# Patient Record
Sex: Male | Born: 1980 | Race: Black or African American | Hispanic: No | Marital: Single | Smoking: Never smoker
Health system: Southern US, Community
[De-identification: ages and names within clinical notes are randomized; demographics above are authoritative.]

## PROBLEM LIST (undated history)

## (undated) DIAGNOSIS — B20 Human immunodeficiency virus [HIV] disease: Secondary | ICD-10-CM

---

## 2017-08-05 DIAGNOSIS — T50905A Adverse effect of unspecified drugs, medicaments and biological substances, initial encounter: Secondary | ICD-10-CM | POA: Diagnosis not present

## 2017-08-05 DIAGNOSIS — R21 Rash and other nonspecific skin eruption: Secondary | ICD-10-CM | POA: Diagnosis not present

## 2017-08-06 ENCOUNTER — Other Ambulatory Visit: Payer: Self-pay

## 2017-08-06 ENCOUNTER — Emergency Department (HOSPITAL_COMMUNITY)
Admission: EM | Admit: 2017-08-06 | Discharge: 2017-08-06 | Disposition: A | Discharge status: Home / Self Care | Payer: 59 | Attending: Emergency Medicine | Admitting: Emergency Medicine

## 2017-08-06 ENCOUNTER — Encounter (HOSPITAL_COMMUNITY): Payer: Self-pay | Admitting: Emergency Medicine

## 2017-08-06 DIAGNOSIS — R21 Rash and other nonspecific skin eruption: Secondary | ICD-10-CM | POA: Diagnosis not present

## 2017-08-06 DIAGNOSIS — Z79899 Other long term (current) drug therapy: Secondary | ICD-10-CM | POA: Diagnosis not present

## 2017-08-06 DIAGNOSIS — T7840XA Allergy, unspecified, initial encounter: Secondary | ICD-10-CM | POA: Diagnosis not present

## 2017-08-06 DIAGNOSIS — T378X5A Adverse effect of other specified systemic anti-infectives and antiparasitics, initial encounter: Secondary | ICD-10-CM | POA: Diagnosis not present

## 2017-08-06 MED ORDER — HYDROXYZINE HCL 10 MG PO TABS: 10.0000 mg | ORAL_TABLET | Freq: Four times a day (QID) | ORAL | 0 refills | Status: AC | PRN

## 2017-08-06 NOTE — ED Provider Notes (Signed)
[facility]-EMERGENCY DEPT Provider Note   CSN: [id number] Arrival date & time: 08/06/17  [id number]     History   Chief Complaint Chief Complaint  Patient presents with  . Allergic Reaction    HPI Timothy Trevino is a 36 y.o. male.  Timothy Trevino is a 36 y.o. Male who presents to the ED complaining of an allergic reaction to flagyl. Patient reports he took one dose of flagyl yesterday and started having itching, rash, and lip tingling. He went to urgent care who told him to stop taking Flagyl and started him on prednisone. He has also been taking benadryl and took some before arrival. Patient reports his lip tingling and rash has resolved. He came to the ED because he was itching so bad. He reports even the itching seems to be much better after taking benadryl. He was started on flagyl by his GI doctor for his BMs "not having the right consistency."  Denies any changes to his lotions, perfumes, soaps or detergents.  No new plants or animals in the house.  He has not had any trouble swallowing, tongue swelling. No SOB. No fevers, abdominal pain, nausea or vomiting.    The history is provided by the patient and medical records. No language interpreter was used.  Allergic Reaction  Presenting symptoms: rash (resolved )   Presenting symptoms: no difficulty swallowing and no wheezing     History reviewed. No pertinent past medical history.  There are no active problems to display for this patient.   History reviewed. No pertinent surgical history.     Home Medications    Prior to Admission medications   Medication Sig Start Date End Date Taking? Authorizing Provider  predniSONE (DELTASONE) 20 MG tablet Take 40 mg by mouth daily with breakfast.   Yes [provider]  hydrOXYzine (ATARAX/VISTARIL) 10 MG tablet Take 1 tablet (10 mg total) by mouth every 6 (six) hours as needed for itching. 08/06/17   Everlene Farrieransie, Tykeshia Tourangeau, PA-C    Family History Family  History  Problem Relation Age of Onset  . Diabetes Other   . Hypertension Other   . Depression Other     Social History Social History   Tobacco Use  . Smoking status: Never Smoker  . Smokeless tobacco: Never Used  Substance Use Topics  . Alcohol use: Yes    Comment: social   . Drug use: No     Allergies   Phenergan [promethazine hcl] and Flagyl [metronidazole]   Review of Systems Review of Systems  Constitutional: Negative for fever.  HENT: Negative for sore throat, trouble swallowing and voice change.   Eyes: Negative for visual disturbance.  Respiratory: Negative for cough, shortness of breath and wheezing.   Cardiovascular: Negative for chest pain.  Gastrointestinal: Negative for abdominal pain, diarrhea, nausea and vomiting.  Genitourinary: Negative for dysuria.  Musculoskeletal: Negative for back pain.  Skin: Positive for rash (resolved ).  Neurological: Negative for light-headedness and headaches.     Physical Exam Updated Vital Signs BP (!) 134/91 (BP Location: Right Arm)   Pulse 79   Temp 97.7 F (36.5 C) (Oral)   Resp 20   SpO2 100%   Physical Exam  Constitutional: He appears well-developed and well-nourished. No distress.  HENT:  Head: Normocephalic and atraumatic.  Right Ear: External ear normal.  Left Ear: External ear normal.  Mouth/Throat: Oropharynx is clear and moist. No oropharyngeal exudate.  Throat is clear. No drooling. No tongue or lip swelling.  Eyes: Conjunctivae are normal. Pupils are equal, round, and reactive to light. Right eye exhibits no discharge. Left eye exhibits no discharge.  Neck: Neck supple.  Cardiovascular: Normal rate, regular rhythm, normal heart sounds and intact distal pulses.  Pulmonary/Chest: Effort normal and breath sounds normal. No stridor. No respiratory distress. He has no wheezes. He has no rales.  Lungs are clear to ascultation bilaterally. Symmetric chest expansion bilaterally. No increased work of  breathing. No rales or rhonchi.    Abdominal: Soft. There is no tenderness.  Musculoskeletal: He exhibits no edema.  Lymphadenopathy:    He has no cervical adenopathy.  Neurological: He is alert. Coordination normal.  Skin: Skin is warm and dry. Capillary refill takes less than 2 seconds. No rash noted. He is not diaphoretic. No erythema. No pallor.  No rashes noted. No hives.   Psychiatric: He has a normal mood and affect. His behavior is normal.  Nursing note and vitals reviewed.    ED Treatments / Results  Labs (all labs ordered are listed, but only abnormal results are displayed) Labs Reviewed - No data to display  EKG  EKG Interpretation None       Radiology No results found.  Procedures Procedures (including critical care time)  Medications Ordered in ED Medications - No data to display   Initial Impression / Assessment and Plan / ED Course  I have reviewed the triage vital signs and the nursing notes.  Pertinent labs & imaging results that were available during my care of the patient were reviewed by me and considered in my medical decision making (see chart for details).    This is a 36 y.o. Male who presents to the ED complaining of an allergic reaction to flagyl. Patient reports he took one dose of flagyl yesterday and started having itching, rash, and lip tingling. He went to urgent care who told him to stop taking Flagyl and started him on prednisone. He has also been taking benadryl and took some before arrival. Patient reports his lip tingling and rash has resolved. He came to the ED because he was itching so bad. He reports even the itching seems to be much better after taking benadryl. He was started on flagyl by his GI doctor for his BMs "not having the right consistency."  Denies any changes to his lotions, perfumes, soaps or detergents.  No new plants or animals in the house.  He has not had any trouble swallowing, tongue swelling. No SOB. On exam the  patient is afebrile nontoxic-appearing.  Lungs are clear to auscultation bilaterally.  No increased work of breathing.  No tongue or lip swelling.  No drooling.  I see no rashes on his body.  I advised that as he is discontinue taking the Flagyl this should likely get better over time.  He can continue taking Benadryl as needed for itching and/or Atarax for itching.  He can continue several days of prednisone, but I did not recommend L extended course of prednisone.  Return precautions discussed. I advised the patient to follow-up with their primary care provider this week. I advised the patient to return to the emergency department with new or worsening symptoms or new concerns. The patient verbalized understanding and agreement with plan.     Final Clinical Impressions(s) / ED Diagnoses   Final diagnoses:  Allergic reaction to drug, initial encounter    ED Discharge Orders        Ordered    hydrOXYzine (ATARAX/VISTARIL) 10 MG tablet  Every 6 hours PRN     08/06/17 0754       Everlene Farrier, PA-C 08/06/17 0830    Little, Ambrose Finland, MD 08/08/17 2133

## 2017-08-06 NOTE — ED Triage Notes (Signed)
Pt states he had an allergic reaction to flagyl yesterday morning where his lips were swollen and he was itching all over  Pt was seen at urgent care and was given a steroid shot and prescribed prednisone  Pt took a benadryl before going to work last night and when he got off at 3am he went home and took Benadryl 50 mg po  Pt is c/o severe itching and irritation at this time

## 2017-09-20 MED FILL — STRIBILD TABLET: 150-150-200 | 30 days supply | Qty: 30 | Fill #0

## 2017-10-03 DIAGNOSIS — J0140 Acute pansinusitis, unspecified: Secondary | ICD-10-CM | POA: Diagnosis not present

## 2017-10-21 MED FILL — STRIBILD TABLET: 150-150-200 | 30 days supply | Qty: 30 | Fill #1

## 2017-11-26 MED FILL — STRIBILD TABLET: 150-150-200 | 30 days supply | Qty: 30 | Fill #2

## 2017-12-01 DIAGNOSIS — R197 Diarrhea, unspecified: Secondary | ICD-10-CM | POA: Diagnosis not present

## 2017-12-01 DIAGNOSIS — K5900 Constipation, unspecified: Secondary | ICD-10-CM | POA: Diagnosis not present

## 2017-12-01 DIAGNOSIS — R194 Change in bowel habit: Secondary | ICD-10-CM | POA: Diagnosis not present

## 2017-12-01 DIAGNOSIS — B20 Human immunodeficiency virus [HIV] disease: Secondary | ICD-10-CM | POA: Diagnosis not present

## 2017-12-06 DIAGNOSIS — R300 Dysuria: Secondary | ICD-10-CM | POA: Diagnosis not present

## 2017-12-06 DIAGNOSIS — F331 Major depressive disorder, recurrent, moderate: Secondary | ICD-10-CM | POA: Diagnosis not present

## 2017-12-06 DIAGNOSIS — E782 Mixed hyperlipidemia: Secondary | ICD-10-CM | POA: Diagnosis not present

## 2017-12-06 DIAGNOSIS — B20 Human immunodeficiency virus [HIV] disease: Secondary | ICD-10-CM | POA: Diagnosis not present

## 2017-12-06 DIAGNOSIS — Z6823 Body mass index (BMI) 23.0-23.9, adult: Secondary | ICD-10-CM | POA: Diagnosis not present

## 2017-12-29 MED FILL — STRIBILD TABLET: 150-150-200 | 30 days supply | Qty: 30 | Fill #3

## 2018-01-06 DIAGNOSIS — B20 Human immunodeficiency virus [HIV] disease: Secondary | ICD-10-CM | POA: Diagnosis not present

## 2018-01-06 DIAGNOSIS — Z7251 High risk heterosexual behavior: Secondary | ICD-10-CM | POA: Diagnosis not present

## 2018-02-02 MED FILL — STRIBILD TABLET: 150-150-200 | 30 days supply | Qty: 30 | Fill #0

## 2018-03-21 MED FILL — STRIBILD TABLET: 150-150-200 | 30 days supply | Qty: 30 | Fill #1

## 2018-04-22 ENCOUNTER — Ambulatory Visit (INDEPENDENT_AMBULATORY_CARE_PROVIDER_SITE_OTHER): Payer: 59 | Admitting: Pharmacist

## 2018-04-22 DIAGNOSIS — Z79899 Other long term (current) drug therapy: Secondary | ICD-10-CM

## 2018-04-22 MED ORDER — ELVITEG-COBIC-EMTRICIT-TENOFDF 150-150-200-300 MG PO TABS: 1.0000 | ORAL_TABLET | Freq: Every day | ORAL | 1 refills | Status: DC

## 2018-04-22 MED FILL — STRIBILD TABLET: 150-150-200 | 30 days supply | Qty: 30 | Fill #0

## 2018-04-22 NOTE — Progress Notes (Signed)
   S: [facility].   Patient is currently taking Stribild for  HIV. Patient is managed by Dr. Tivis RingerLarsen for this.   Adherence: denies any missed doses  Efficacy: reports that it works well for him. He was switched to another HIV med for renal protection but he switched back to Stribild after the other made him sick. He is very happy with the Stribild.  Current adverse effects: denies any adverse effects  Monitoring: HIV RNA: undetectable per patient Renal toxicity: reports ID monitors renal function and it has been normal. S/sx of bone loss: denies Musculoskeletal complaints: denies S/sx immune reconstitution syndrome: denies S/sx of lactic acidosis/hepatomegaly: denies    O:     No results found for: WBC, HGB, HCT, MCV, PLT    Chemistry   No results found for: NA, K, CL, CO2, BUN, CREATININE, GLU No results found for: CALCIUM, ALKPHOS, AST, ALT, BILITOT     A/P: 1. Medication review: patient currently on Stribild for HIV and is tolerating it well with no adverse effects and good control of his HIV. Reviewed the medication with him, including possible adverse effects, the need for 100% adherence, and to regularly get labs with his ID clinic. No recommendations for any changes at this time.     Alvino BloodStacey Karl Hammer, PharmD, BCPS, BCACP, CPP Clinical Pharmacist Practitioner  [phone]

## 2018-06-06 MED FILL — STRIBILD TABLET: 150-150-200 | 30 days supply | Qty: 30 | Fill #1

## 2018-06-09 DIAGNOSIS — E782 Mixed hyperlipidemia: Secondary | ICD-10-CM | POA: Diagnosis not present

## 2018-06-09 DIAGNOSIS — Z6824 Body mass index (BMI) 24.0-24.9, adult: Secondary | ICD-10-CM | POA: Diagnosis not present

## 2018-06-09 DIAGNOSIS — B20 Human immunodeficiency virus [HIV] disease: Secondary | ICD-10-CM | POA: Diagnosis not present

## 2018-06-09 DIAGNOSIS — F331 Major depressive disorder, recurrent, moderate: Secondary | ICD-10-CM | POA: Diagnosis not present

## 2018-07-18 ENCOUNTER — Other Ambulatory Visit: Payer: Self-pay | Admitting: Internal Medicine

## 2018-07-19 ENCOUNTER — Other Ambulatory Visit: Payer: Self-pay | Admitting: Pharmacist

## 2018-07-19 MED ORDER — ELVITEG-COBIC-EMTRICIT-TENOFDF 150-150-200-300 MG PO TABS: 1.0000 | ORAL_TABLET | Freq: Every day | ORAL | 2 refills | Status: DC

## 2018-07-19 MED FILL — STRIBILD TABLET: 150-150-200 | 30 days supply | Qty: 30 | Fill #0

## 2018-08-23 DIAGNOSIS — R5381 Other malaise: Secondary | ICD-10-CM | POA: Diagnosis not present

## 2018-08-23 DIAGNOSIS — R05 Cough: Secondary | ICD-10-CM | POA: Diagnosis not present

## 2018-08-23 DIAGNOSIS — R5383 Other fatigue: Secondary | ICD-10-CM | POA: Diagnosis not present

## 2018-08-23 DIAGNOSIS — J40 Bronchitis, not specified as acute or chronic: Secondary | ICD-10-CM | POA: Diagnosis not present

## 2018-08-23 DIAGNOSIS — H6123 Impacted cerumen, bilateral: Secondary | ICD-10-CM | POA: Diagnosis not present

## 2018-09-12 MED FILL — STRIBILD TABLET: 150-150-200 | 30 days supply | Qty: 30 | Fill #1

## 2018-10-03 ENCOUNTER — Emergency Department (HOSPITAL_BASED_OUTPATIENT_CLINIC_OR_DEPARTMENT_OTHER)
Admission: EM | Admit: 2018-10-03 | Discharge: 2018-10-03 | Disposition: A | Discharge status: Home / Self Care | Payer: 59 | Attending: Emergency Medicine | Admitting: Emergency Medicine

## 2018-10-03 ENCOUNTER — Emergency Department (HOSPITAL_BASED_OUTPATIENT_CLINIC_OR_DEPARTMENT_OTHER): Payer: 59

## 2018-10-03 ENCOUNTER — Other Ambulatory Visit: Payer: Self-pay

## 2018-10-03 ENCOUNTER — Encounter (HOSPITAL_BASED_OUTPATIENT_CLINIC_OR_DEPARTMENT_OTHER): Payer: Self-pay | Admitting: *Deleted

## 2018-10-03 DIAGNOSIS — R51 Headache: Principal | ICD-10-CM

## 2018-10-03 DIAGNOSIS — M542 Cervicalgia: Secondary | ICD-10-CM

## 2018-10-03 DIAGNOSIS — Z79899 Other long term (current) drug therapy: Secondary | ICD-10-CM

## 2018-10-03 DIAGNOSIS — Z21 Asymptomatic human immunodeficiency virus [HIV] infection status: Secondary | ICD-10-CM

## 2018-10-03 DIAGNOSIS — S299XXA Unspecified injury of thorax, initial encounter: Secondary | ICD-10-CM | POA: Diagnosis not present

## 2018-10-03 DIAGNOSIS — S0990XA Unspecified injury of head, initial encounter: Secondary | ICD-10-CM | POA: Diagnosis not present

## 2018-10-03 HISTORY — DX: Human immunodeficiency virus (HIV) disease: B20

## 2018-10-03 MED ORDER — ACETAMINOPHEN 500 MG PO TABS
1000.0000 mg | ORAL_TABLET | Freq: Once | ORAL | Status: AC
  Administered 2018-10-03: 1000 mg via ORAL
  Filled 2018-10-03: qty 2

## 2018-10-03 MED ORDER — METHOCARBAMOL 500 MG PO TABS: 500.0000 mg | ORAL_TABLET | Freq: Two times a day (BID) | ORAL | 0 refills | Status: AC

## 2018-10-03 MED FILL — METHOCARBAMOL 500 MG TABLET: 500 | 7 days supply | Qty: 14 | Fill #0
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
## 2018-10-03 NOTE — Discharge Instructions (Signed)
Your CT today was negative for any acute process. Have prescribed muscle relaxers for your pain, please do not drink or drive while taking this medications as they can make you drowsy.  Please follow-up with PCP in 1 week for reevaluation of your symptoms. If you experience any dizziness, weakness or worsening symptoms please return to the ED.
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
## 2018-10-03 NOTE — ED Notes (Signed)
Patient transported to X-ray 

## 2018-10-03 NOTE — ED Notes (Signed)
Patient transported to CT 
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
## 2018-10-28 MED FILL — STRIBILD TABLET: 150-150-200 | 30 days supply | Qty: 30 | Fill #2

## 2018-12-06 MED FILL — STRIBILD TABLET: 150-150-200 | 30 days supply | Qty: 30 | Fill #0

## 2019-01-11 MED FILL — STRIBILD TABLET: 150-150-200 | 30 days supply | Qty: 30 | Fill #1

## 2019-02-03 DIAGNOSIS — E782 Mixed hyperlipidemia: Secondary | ICD-10-CM | POA: Diagnosis not present

## 2019-02-03 DIAGNOSIS — U071 COVID-19: Secondary | ICD-10-CM | POA: Diagnosis not present

## 2019-02-03 DIAGNOSIS — B20 Human immunodeficiency virus [HIV] disease: Secondary | ICD-10-CM | POA: Diagnosis not present

## 2019-02-16 MED FILL — STRIBILD TABLET: 150-150-200 | 30 days supply | Qty: 30 | Fill #2

## 2019-02-17 DIAGNOSIS — U071 COVID-19: Secondary | ICD-10-CM | POA: Diagnosis not present

## 2019-02-17 DIAGNOSIS — B20 Human immunodeficiency virus [HIV] disease: Secondary | ICD-10-CM | POA: Diagnosis not present

## 2019-04-04 ENCOUNTER — Other Ambulatory Visit: Payer: Self-pay | Admitting: Pharmacist

## 2019-04-04 MED ORDER — STRIBILD 150-150-200-300 MG PO TABS: 1.0000 | ORAL_TABLET | Freq: Every day | ORAL | 1 refills | Status: DC

## 2019-04-04 MED FILL — STRIBILD TABLET: 150-150-200 | 30 days supply | Qty: 30 | Fill #0

## 2019-04-15 DIAGNOSIS — J029 Acute pharyngitis, unspecified: Secondary | ICD-10-CM | POA: Diagnosis not present

## 2019-05-21 DIAGNOSIS — Z23 Encounter for immunization: Secondary | ICD-10-CM | POA: Diagnosis not present

## 2019-05-26 MED FILL — STRIBILD TABLET: 150-150-200 | 30 days supply | Qty: 30 | Fill #1

## 2019-06-13 ENCOUNTER — Encounter (HOSPITAL_COMMUNITY): Payer: Self-pay | Admitting: Emergency Medicine

## 2019-06-17 DIAGNOSIS — H5710 Ocular pain, unspecified eye: Secondary | ICD-10-CM | POA: Diagnosis not present

## 2019-06-17 DIAGNOSIS — H10029 Other mucopurulent conjunctivitis, unspecified eye: Secondary | ICD-10-CM | POA: Diagnosis not present

## 2019-06-21 ENCOUNTER — Telehealth (INDEPENDENT_AMBULATORY_CARE_PROVIDER_SITE_OTHER): Payer: 59 | Admitting: Pharmacist

## 2019-06-21 DIAGNOSIS — Z79899 Other long term (current) drug therapy: Secondary | ICD-10-CM

## 2019-06-21 NOTE — Progress Notes (Signed)
   S: Patient presents virtually for review of his specialty medication  Patient is currently taking Stribild for  HIV. Patient is managed by Dr. Alla German for this.   Adherence: denies any missed doses  Efficacy: reports that it is continuing to work well for him.  Current adverse effects: denies any adverse effects  Monitoring: HIV RNA: undetectable per patient Renal toxicity: reports ID monitors renal function and it has been normal. S/sx of bone loss: denies Musculoskeletal complaints: denies S/sx immune reconstitution syndrome: denies S/sx of lactic acidosis/hepatomegaly: denies    O:     No results found for: WBC, HGB, HCT, MCV, PLT    Chemistry   No results found for: NA, K, CL, CO2, BUN, CREATININE, GLU No results found for: CALCIUM, ALKPHOS, AST, ALT, BILITOT     A/P: 1. Medication review: patient currently on Stribild for HIV and is tolerating it well with no adverse effects and good control of his HIV. Reviewed the medication with him, including possible adverse effects, the need for 100% adherence, and to regularly get labs with his ID clinic. No recommendations for any changes at this time.     Christella Hartigan, PharmD, BCPS, BCACP, CPP Clinical Pharmacist Practitioner  [phone]

## 2019-07-07 ENCOUNTER — Other Ambulatory Visit: Payer: Self-pay | Admitting: Pharmacist

## 2019-07-07 MED ORDER — STRIBILD 150-150-200-300 MG PO TABS: 1.0000 | ORAL_TABLET | Freq: Every day | ORAL | 0 refills | Status: DC

## 2019-07-07 MED FILL — STRIBILD TABLET: 150-150-200 | 30 days supply | Qty: 30 | Fill #0

## 2019-07-27 MED FILL — STRIBILD TABLET: 150-150-200 | 30 days supply | Qty: 30 | Fill #0

## 2019-10-09 ENCOUNTER — Other Ambulatory Visit: Payer: Self-pay | Admitting: Pharmacist

## 2019-10-09 DIAGNOSIS — B20 Human immunodeficiency virus [HIV] disease: Secondary | ICD-10-CM

## 2019-10-09 MED ORDER — STRIBILD 150-150-200-300 MG PO TABS: 1.0000 | ORAL_TABLET | Freq: Every day | ORAL | 0 refills | Status: DC

## 2019-10-09 MED ORDER — STRIBILD 150-150-200-300 MG PO TABS: 1.0000 | ORAL_TABLET | Freq: Every day | ORAL | 6 refills | Status: AC

## 2019-10-09 MED FILL — STRIBILD TABLET: 150-150-200 | 30 days supply | Qty: 30 | Fill #0

## 2019-10-09 NOTE — Progress Notes (Signed)
Refill sent in by patient's ID doctor, Dr. Tivis Ringer. Resent Rx to [facility]. Patient was last seen by Viann Fish in our [facility] on 06/21/2019. No follow up needed at this time.

## 2020-01-31 DIAGNOSIS — Z20828 Contact with and (suspected) exposure to other viral communicable diseases: Secondary | ICD-10-CM | POA: Diagnosis not present

## 2020-01-31 DIAGNOSIS — Z03818 Encounter for observation for suspected exposure to other biological agents ruled out: Secondary | ICD-10-CM | POA: Diagnosis not present
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
## 2020-05-10 MED FILL — STRIBILD TABLET: 150-150-200 | 30 days supply | Qty: 30 | Fill #0

## 2020-06-01 MED FILL — STRIBILD TABLET: 150-150-200 | 30 days supply | Qty: 30 | Fill #0

## 2020-07-02 MED FILL — STRIBILD TABLET: 150-150-200 | 30 days supply | Qty: 30 | Fill #1

## 2020-09-30 DIAGNOSIS — Z224 Carrier of infections with a predominantly sexual mode of transmission: Secondary | ICD-10-CM | POA: Diagnosis not present

## 2020-11-28 DIAGNOSIS — Z113 Encounter for screening for infections with a predominantly sexual mode of transmission: Secondary | ICD-10-CM | POA: Diagnosis not present

## 2020-11-28 DIAGNOSIS — B20 Human immunodeficiency virus [HIV] disease: Secondary | ICD-10-CM | POA: Diagnosis not present

## 2020-11-28 DIAGNOSIS — Z111 Encounter for screening for respiratory tuberculosis: Secondary | ICD-10-CM | POA: Diagnosis not present

## 2020-11-28 DIAGNOSIS — A539 Syphilis, unspecified: Secondary | ICD-10-CM | POA: Diagnosis not present

## 2020-11-28 DIAGNOSIS — Q649 Congenital malformation of urinary system, unspecified: Secondary | ICD-10-CM | POA: Diagnosis not present

## 2020-12-03 ENCOUNTER — Other Ambulatory Visit (HOSPITAL_COMMUNITY): Payer: Self-pay

## 2020-12-09 ENCOUNTER — Other Ambulatory Visit (HOSPITAL_COMMUNITY): Payer: Self-pay

## 2020-12-26 DIAGNOSIS — E559 Vitamin D deficiency, unspecified: Secondary | ICD-10-CM | POA: Diagnosis not present

## 2020-12-26 DIAGNOSIS — B20 Human immunodeficiency virus [HIV] disease: Secondary | ICD-10-CM | POA: Diagnosis not present

## 2020-12-26 DIAGNOSIS — R21 Rash and other nonspecific skin eruption: Secondary | ICD-10-CM | POA: Diagnosis not present

## 2021-01-30 DIAGNOSIS — Z0000 Encounter for general adult medical examination without abnormal findings: Secondary | ICD-10-CM | POA: Diagnosis not present

## 2021-01-30 DIAGNOSIS — K625 Hemorrhage of anus and rectum: Secondary | ICD-10-CM | POA: Diagnosis not present

## 2021-01-30 DIAGNOSIS — Z111 Encounter for screening for respiratory tuberculosis: Secondary | ICD-10-CM | POA: Diagnosis not present

## 2021-01-30 DIAGNOSIS — B20 Human immunodeficiency virus [HIV] disease: Secondary | ICD-10-CM | POA: Diagnosis not present

## 2021-01-30 DIAGNOSIS — Z113 Encounter for screening for infections with a predominantly sexual mode of transmission: Secondary | ICD-10-CM | POA: Diagnosis not present

## 2021-01-30 DIAGNOSIS — E559 Vitamin D deficiency, unspecified: Secondary | ICD-10-CM | POA: Diagnosis not present

## 2021-01-31 DIAGNOSIS — Z113 Encounter for screening for infections with a predominantly sexual mode of transmission: Secondary | ICD-10-CM | POA: Diagnosis not present

## 2021-01-31 DIAGNOSIS — B20 Human immunodeficiency virus [HIV] disease: Secondary | ICD-10-CM | POA: Diagnosis not present

## 2021-01-31 DIAGNOSIS — E559 Vitamin D deficiency, unspecified: Secondary | ICD-10-CM | POA: Diagnosis not present

## 2021-02-05 DIAGNOSIS — Z111 Encounter for screening for respiratory tuberculosis: Secondary | ICD-10-CM | POA: Diagnosis not present

## 2021-04-28 DIAGNOSIS — B20 Human immunodeficiency virus [HIV] disease: Secondary | ICD-10-CM | POA: Diagnosis not present

## 2021-04-28 DIAGNOSIS — Z23 Encounter for immunization: Secondary | ICD-10-CM | POA: Diagnosis not present

## 2021-04-28 DIAGNOSIS — D849 Immunodeficiency, unspecified: Secondary | ICD-10-CM | POA: Diagnosis not present

## 2021-04-28 DIAGNOSIS — K625 Hemorrhage of anus and rectum: Secondary | ICD-10-CM | POA: Diagnosis not present

## 2021-04-28 DIAGNOSIS — F329 Major depressive disorder, single episode, unspecified: Secondary | ICD-10-CM | POA: Diagnosis not present

## 2021-04-28 DIAGNOSIS — E559 Vitamin D deficiency, unspecified: Secondary | ICD-10-CM | POA: Diagnosis not present

## 2021-04-28 DIAGNOSIS — Z113 Encounter for screening for infections with a predominantly sexual mode of transmission: Secondary | ICD-10-CM | POA: Diagnosis not present

## 2021-05-02 DIAGNOSIS — K625 Hemorrhage of anus and rectum: Secondary | ICD-10-CM | POA: Diagnosis not present

## 2021-05-02 DIAGNOSIS — A630 Anogenital (venereal) warts: Secondary | ICD-10-CM | POA: Diagnosis not present

## 2021-05-05 ENCOUNTER — Other Ambulatory Visit (HOSPITAL_COMMUNITY): Payer: Self-pay

## 2021-05-16 DIAGNOSIS — Z23 Encounter for immunization: Secondary | ICD-10-CM | POA: Diagnosis not present

## 2021-05-16 DIAGNOSIS — R194 Change in bowel habit: Secondary | ICD-10-CM | POA: Diagnosis not present

## 2021-05-16 DIAGNOSIS — K921 Melena: Secondary | ICD-10-CM | POA: Diagnosis not present

## 2021-05-26 DIAGNOSIS — F329 Major depressive disorder, single episode, unspecified: Secondary | ICD-10-CM | POA: Diagnosis not present
